# Patient Record
Sex: Male | Born: 1985 | Race: Black or African American | Hispanic: No | Marital: Single | Smoking: Never smoker
Health system: Southern US, Community
[De-identification: ages and names within clinical notes are randomized; demographics above are authoritative.]

## PROBLEM LIST (undated history)

## (undated) DIAGNOSIS — S56922A Laceration of unspecified muscles, fascia and tendons at forearm level, left arm, initial encounter: Secondary | ICD-10-CM

## (undated) DIAGNOSIS — S51812A Laceration without foreign body of left forearm, initial encounter: Secondary | ICD-10-CM

## (undated) HISTORY — PX: LACERATION REPAIR: SHX5168

---

## 2015-01-21 ENCOUNTER — Emergency Department (HOSPITAL_COMMUNITY)
Admission: EM | Admit: 2015-01-21 | Discharge: 2015-01-21 | Disposition: A | Discharge status: Home / Self Care | Payer: Self-pay | Attending: Emergency Medicine | Admitting: Emergency Medicine

## 2015-01-21 ENCOUNTER — Emergency Department (HOSPITAL_COMMUNITY): Payer: Self-pay

## 2015-01-21 ENCOUNTER — Encounter (HOSPITAL_COMMUNITY)
Admission: EM | Disposition: A | Discharge status: Home / Self Care | Payer: Self-pay | Source: Home / Self Care | Attending: Emergency Medicine

## 2015-01-21 ENCOUNTER — Encounter (HOSPITAL_COMMUNITY): Payer: Self-pay | Admitting: *Deleted

## 2015-01-21 ENCOUNTER — Emergency Department (HOSPITAL_COMMUNITY): Payer: Self-pay | Admitting: Certified Registered Nurse Anesthetist

## 2015-01-21 DIAGNOSIS — Y92481 Parking lot as the place of occurrence of the external cause: Secondary | ICD-10-CM | POA: Insufficient documentation

## 2015-01-21 DIAGNOSIS — Z23 Encounter for immunization: Secondary | ICD-10-CM | POA: Insufficient documentation

## 2015-01-21 DIAGNOSIS — S61512A Laceration without foreign body of left wrist, initial encounter: Secondary | ICD-10-CM | POA: Insufficient documentation

## 2015-01-21 DIAGNOSIS — S65012A Laceration of ulnar artery at wrist and hand level of left arm, initial encounter: Secondary | ICD-10-CM | POA: Insufficient documentation

## 2015-01-21 DIAGNOSIS — S66922A Laceration of unspecified muscle, fascia and tendon at wrist and hand level, left hand, initial encounter: Secondary | ICD-10-CM

## 2015-01-21 DIAGNOSIS — S51812A Laceration without foreign body of left forearm, initial encounter: Secondary | ICD-10-CM

## 2015-01-21 DIAGNOSIS — S66022A Laceration of long flexor muscle, fascia and tendon of left thumb at wrist and hand level, initial encounter: Secondary | ICD-10-CM | POA: Insufficient documentation

## 2015-01-21 DIAGNOSIS — S6492XA Injury of unspecified nerve at wrist and hand level of left arm, initial encounter: Secondary | ICD-10-CM | POA: Insufficient documentation

## 2015-01-21 HISTORY — PX: EMBOLECTOMY: SHX44

## 2015-01-21 HISTORY — DX: Laceration without foreign body of left forearm, initial encounter: S51.812A

## 2015-01-21 LAB — CBC WITH DIFFERENTIAL/PLATELET
BASOS ABS: 0 10*3/uL (ref 0.0–0.1)
BASOS PCT: 0 %
EOS PCT: 1 %
Eosinophils Absolute: 0.1 10*3/uL (ref 0.0–0.7)
HCT: 43 % (ref 39.0–52.0)
Hemoglobin: 14.5 g/dL (ref 13.0–17.0)
LYMPHS PCT: 34 %
Lymphs Abs: 2.6 10*3/uL (ref 0.7–4.0)
MCH: 29.2 pg (ref 26.0–34.0)
MCHC: 33.7 g/dL (ref 30.0–36.0)
MCV: 86.5 fL (ref 78.0–100.0)
MONO ABS: 0.5 10*3/uL (ref 0.1–1.0)
Monocytes Relative: 6 %
Neutro Abs: 4.4 10*3/uL (ref 1.7–7.7)
Neutrophils Relative %: 59 %
PLATELETS: 219 10*3/uL (ref 150–400)
RBC: 4.97 MIL/uL (ref 4.22–5.81)
RDW: 13.3 % (ref 11.5–15.5)
WBC: 7.6 10*3/uL (ref 4.0–10.5)

## 2015-01-21 LAB — ABO/RH: ABO/RH(D): B POS

## 2015-01-21 LAB — TYPE AND SCREEN
ABO/RH(D): B POS
ANTIBODY SCREEN: NEGATIVE

## 2015-01-21 LAB — BASIC METABOLIC PANEL
Anion gap: 12 (ref 5–15)
BUN: 9 mg/dL (ref 6–20)
CALCIUM: 9.4 mg/dL (ref 8.9–10.3)
CO2: 23 mmol/L (ref 22–32)
CREATININE: 0.99 mg/dL (ref 0.61–1.24)
Chloride: 103 mmol/L (ref 101–111)
GFR calc Af Amer: >60 mL/min (ref >60)
GLUCOSE: 152 mg/dL — AB (ref 65–99)
POTASSIUM: 3.1 mmol/L — AB (ref 3.5–5.1)
SODIUM: 138 mmol/L (ref 135–145)

## 2015-01-21 SURGERY — EMBOLECTOMY
Anesthesia: General | Site: Arm Lower | Laterality: Left

## 2015-01-21 MED ORDER — ONDANSETRON HCL 4 MG/2ML IJ SOLN
4.0000 mg | Freq: Once | INTRAMUSCULAR | Status: AC
  Administered 2015-01-21: 4 mg via INTRAVENOUS
  Filled 2015-01-21: qty 2

## 2015-01-21 MED ORDER — SUGAMMADEX SODIUM 200 MG/2ML IV SOLN
INTRAVENOUS | Status: DC | PRN
  Administered 2015-01-21: 200 mg via INTRAVENOUS

## 2015-01-21 MED ORDER — HYDROMORPHONE HCL 1 MG/ML IJ SOLN
1.0000 mg | Freq: Once | INTRAMUSCULAR | Status: AC
Start: 2015-01-21 — End: 2015-01-21
  Administered 2015-01-21: 1 mg via INTRAVENOUS
  Filled 2015-01-21: qty 1

## 2015-01-21 MED ORDER — HYDROMORPHONE HCL 1 MG/ML IJ SOLN: 0.2500 mg | INTRAMUSCULAR | Status: DC | PRN

## 2015-01-21 MED ORDER — SUCCINYLCHOLINE CHLORIDE 20 MG/ML IJ SOLN
INTRAMUSCULAR | Status: DC | PRN
  Administered 2015-01-21: 120 mg via INTRAVENOUS

## 2015-01-21 MED ORDER — OXYCODONE-ACETAMINOPHEN 5-325 MG PO TABS: 1.0000 | ORAL_TABLET | ORAL | Status: DC | PRN

## 2015-01-21 MED ORDER — 0.9 % SODIUM CHLORIDE (POUR BTL) OPTIME
TOPICAL | Status: DC | PRN
  Administered 2015-01-21: 1000 mL

## 2015-01-21 MED ORDER — ROCURONIUM BROMIDE 100 MG/10ML IV SOLN
INTRAVENOUS | Status: DC | PRN
  Administered 2015-01-21: 30 mg via INTRAVENOUS

## 2015-01-21 MED ORDER — LACTATED RINGERS IV SOLN
INTRAVENOUS | Status: DC | PRN
  Administered 2015-01-21: 09:00:00 via INTRAVENOUS

## 2015-01-21 MED ORDER — FENTANYL CITRATE (PF) 250 MCG/5ML IJ SOLN
INTRAMUSCULAR | Status: AC
  Filled 2015-01-21: qty 5

## 2015-01-21 MED ORDER — THROMBIN 20000 UNITS EX SOLR
CUTANEOUS | Status: AC
  Filled 2015-01-21: qty 20000

## 2015-01-21 MED ORDER — PROPOFOL 10 MG/ML IV BOLUS
INTRAVENOUS | Status: DC | PRN
  Administered 2015-01-21: 200 mg via INTRAVENOUS

## 2015-01-21 MED ORDER — CEFAZOLIN SODIUM-DEXTROSE 2-3 GM-% IV SOLR
INTRAVENOUS | Status: DC | PRN
  Administered 2015-01-21: 2 g via INTRAVENOUS

## 2015-01-21 MED ORDER — ONDANSETRON HCL 4 MG/2ML IJ SOLN
INTRAMUSCULAR | Status: DC | PRN
  Administered 2015-01-21: 4 mg via INTRAVENOUS

## 2015-01-21 MED ORDER — SUGAMMADEX SODIUM 200 MG/2ML IV SOLN
INTRAVENOUS | Status: AC
  Filled 2015-01-21: qty 2

## 2015-01-21 MED ORDER — FENTANYL CITRATE (PF) 100 MCG/2ML IJ SOLN
INTRAMUSCULAR | Status: DC | PRN
  Administered 2015-01-21: 100 ug via INTRAVENOUS
  Administered 2015-01-21: 50 ug via INTRAVENOUS

## 2015-01-21 MED ORDER — MIDAZOLAM HCL 5 MG/5ML IJ SOLN
INTRAMUSCULAR | Status: DC | PRN
  Administered 2015-01-21: 1 mg via INTRAVENOUS

## 2015-01-21 MED ORDER — PROPOFOL 10 MG/ML IV BOLUS
INTRAVENOUS | Status: AC
  Filled 2015-01-21: qty 20

## 2015-01-21 MED ORDER — OXYCODONE-ACETAMINOPHEN 5-325 MG PO TABS: 1.0000 | ORAL_TABLET | Freq: Four times a day (QID) | ORAL | Status: DC | PRN

## 2015-01-21 MED ORDER — MIDAZOLAM HCL 2 MG/2ML IJ SOLN
INTRAMUSCULAR | Status: AC
  Filled 2015-01-21: qty 4

## 2015-01-21 MED ORDER — TETANUS-DIPHTH-ACELL PERTUSSIS 5-2.5-18.5 LF-MCG/0.5 IM SUSP
0.5000 mL | Freq: Once | INTRAMUSCULAR | Status: AC
  Administered 2015-01-21: 0.5 mL via INTRAMUSCULAR
  Filled 2015-01-21: qty 0.5

## 2015-01-21 MED ORDER — MEPERIDINE HCL 25 MG/ML IJ SOLN: 6.2500 mg | INTRAMUSCULAR | Status: DC | PRN

## 2015-01-21 MED ORDER — SUCCINYLCHOLINE CHLORIDE 20 MG/ML IJ SOLN: 250.0000 mg | INTRAVENOUS | Status: DC | PRN

## 2015-01-21 MED ORDER — SODIUM CHLORIDE 0.9 % IV SOLN
INTRAVENOUS | Status: DC | PRN
  Administered 2015-01-21 (×2)

## 2015-01-21 MED ORDER — LIDOCAINE HCL (CARDIAC) 20 MG/ML IV SOLN
INTRAVENOUS | Status: DC | PRN
  Administered 2015-01-21: 80 mg via INTRAVENOUS

## 2015-01-21 MED ORDER — PROMETHAZINE HCL 25 MG/ML IJ SOLN: 6.2500 mg | INTRAMUSCULAR | Status: DC | PRN

## 2015-01-21 MED ORDER — LACTATED RINGERS IV SOLN: INTRAVENOUS | Status: DC

## 2015-01-21 SURGICAL SUPPLY — 54 items
BANDAGE ELASTIC 4 VELCRO ST LF (GAUZE/BANDAGES/DRESSINGS) ×3 IMPLANT
BANDAGE ESMARK 6X9 LF (GAUZE/BANDAGES/DRESSINGS) IMPLANT
BNDG ESMARK 6X9 LF (GAUZE/BANDAGES/DRESSINGS)
BNDG GAUZE ELAST 4 BULKY (GAUZE/BANDAGES/DRESSINGS) ×3 IMPLANT
CANISTER SUCTION 2500CC (MISCELLANEOUS) ×3 IMPLANT
CATH EMB 3FR 80CM (CATHETERS) IMPLANT
CATH EMB 4FR 80CM (CATHETERS) IMPLANT
CATH EMB 5FR 80CM (CATHETERS) IMPLANT
CLIP TI MEDIUM 24 (CLIP) ×3 IMPLANT
CLIP TI WIDE RED SMALL 24 (CLIP) ×3 IMPLANT
CLIP TI WIDE RED SMALL 6 (CLIP) ×3 IMPLANT
CUFF TOURNIQUET SINGLE 18IN (TOURNIQUET CUFF) IMPLANT
CUFF TOURNIQUET SINGLE 24IN (TOURNIQUET CUFF) IMPLANT
CUFF TOURNIQUET SINGLE 34IN LL (TOURNIQUET CUFF) IMPLANT
CUFF TOURNIQUET SINGLE 44IN (TOURNIQUET CUFF) IMPLANT
DRAIN CHANNEL 15F RND FF W/TCR (WOUND CARE) IMPLANT
DRAPE X-RAY CASS 24X20 (DRAPES) IMPLANT
ELECT REM PT RETURN 9FT ADLT (ELECTROSURGICAL) ×3
ELECTRODE REM PT RTRN 9FT ADLT (ELECTROSURGICAL) ×1 IMPLANT
EVACUATOR SILICONE 100CC (DRAIN) IMPLANT
GAUZE SPONGE 4X4 12PLY STRL (GAUZE/BANDAGES/DRESSINGS) ×3 IMPLANT
GLOVE BIO SURGEON STRL SZ 6.5 (GLOVE) ×2 IMPLANT
GLOVE BIO SURGEON STRL SZ7.5 (GLOVE) ×6 IMPLANT
GLOVE BIO SURGEONS STRL SZ 6.5 (GLOVE) ×1
GLOVE BIOGEL PI IND STRL 6.5 (GLOVE) ×2 IMPLANT
GLOVE BIOGEL PI IND STRL 7.0 (GLOVE) ×1 IMPLANT
GLOVE BIOGEL PI IND STRL 8 (GLOVE) ×1 IMPLANT
GLOVE BIOGEL PI INDICATOR 6.5 (GLOVE) ×4
GLOVE BIOGEL PI INDICATOR 7.0 (GLOVE) ×2
GLOVE BIOGEL PI INDICATOR 8 (GLOVE) ×2
GOWN STRL REUS W/ TWL LRG LVL3 (GOWN DISPOSABLE) ×3 IMPLANT
GOWN STRL REUS W/TWL LRG LVL3 (GOWN DISPOSABLE) ×6
KIT BASIN OR (CUSTOM PROCEDURE TRAY) ×3 IMPLANT
KIT ROOM TURNOVER OR (KITS) ×3 IMPLANT
LIQUID BAND (GAUZE/BANDAGES/DRESSINGS) ×3 IMPLANT
NS IRRIG 1000ML POUR BTL (IV SOLUTION) ×6 IMPLANT
PACK PERIPHERAL VASCULAR (CUSTOM PROCEDURE TRAY) ×3 IMPLANT
PAD ARMBOARD 7.5X6 YLW CONV (MISCELLANEOUS) ×6 IMPLANT
SET COLLECT BLD 21X3/4 12 (NEEDLE) IMPLANT
SPONGE SURGIFOAM ABS GEL 100 (HEMOSTASIS) IMPLANT
STAPLER VISISTAT (STAPLE) IMPLANT
STOPCOCK 4 WAY LG BORE MALE ST (IV SETS) IMPLANT
SUT ETHILON 3 0 PS 1 (SUTURE) ×6 IMPLANT
SUT PROLENE 5 0 C 1 24 (SUTURE) ×3 IMPLANT
SUT PROLENE 6 0 BV (SUTURE) IMPLANT
SUT VIC AB 2-0 CTB1 (SUTURE) ×3 IMPLANT
SUT VIC AB 3-0 SH 27 (SUTURE) ×2
SUT VIC AB 3-0 SH 27X BRD (SUTURE) ×1 IMPLANT
SUT VICRYL 4-0 PS2 18IN ABS (SUTURE) ×3 IMPLANT
SYR 3ML LL SCALE MARK (SYRINGE) ×3 IMPLANT
TRAY FOLEY W/METER SILVER 16FR (SET/KITS/TRAYS/PACK) ×3 IMPLANT
TUBING EXTENTION W/L.L. (IV SETS) IMPLANT
UNDERPAD 30X30 INCONTINENT (UNDERPADS AND DIAPERS) ×3 IMPLANT
WATER STERILE IRR 1000ML POUR (IV SOLUTION) ×3 IMPLANT
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
## 2015-01-21 NOTE — Anesthesia Postprocedure Evaluation (Signed)
  Anesthesia Post-op Note  Patient: Andre Moreno  Procedure(s) Performed: Procedure(s): LEFT ARM EXPLORATION (Left)  Patient Location: PACU  Anesthesia Type:General  Level of Consciousness: awake and alert   Airway and Oxygen Therapy: Patient Spontanous Breathing  Post-op Pain: mild  Post-op Assessment: Post-op Vital signs reviewed and Patient's Cardiovascular Status Stable              Post-op Vital Signs: Reviewed and stable  Last Vitals:  Filed Vitals:   01/21/15 1130  BP:   Pulse: 76  Temp:   Resp: 24    Complications: No apparent anesthesia complications
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
## 2015-01-21 NOTE — Interval H&P Note (Signed)
History and Physical Interval Note:  01/21/2015 10:35 AM  Andre Moreno  has presented today for surgery, with the diagnosis of Arterial Bleed/Left Arm  The various methods of treatment have been discussed with the patient and family. After consideration of risks, benefits and other options for treatment, the patient has consented to  Procedure(s): LEFT ARM EXPLORATION (Left) as a surgical intervention .  The patient's history has been reviewed, patient examined, no change in status, stable for surgery.  I have reviewed the patient's chart and labs.  Questions were answered to the patient's satisfaction.     Waverly Ferrariickson, Jagger Beahm

## 2015-01-21 NOTE — ED Notes (Signed)
Dr Edilia Boickson, vascular, at bedside.
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
## 2015-01-21 NOTE — ED Notes (Signed)
Patient transported to X-ray 

## 2015-01-21 NOTE — Anesthesia Procedure Notes (Signed)
Procedure Name: Intubation Date/Time: 01/21/2015 9:28 AM Performed by: Virgel GessHOLTZMAN, Rynn Markiewicz LEFFEW Pre-anesthesia Checklist: Patient identified, Patient being monitored, Timeout performed, Emergency Drugs available and Suction available Patient Re-evaluated:Patient Re-evaluated prior to inductionOxygen Delivery Method: Circle System Utilized Preoxygenation: Pre-oxygenation with 100% oxygen Intubation Type: IV induction Laryngoscope Size: Mac and 3 Grade View: Grade I Tube type: Oral Tube size: 8.0 mm Number of attempts: 1 Airway Equipment and Method: Stylet Placement Confirmation: ETT inserted through vocal cords under direct vision,  positive ETCO2 and breath sounds checked- equal and bilateral Secured at: 21 cm Tube secured with: Tape Dental Injury: Teeth and Oropharynx as per pre-operative assessment  Comments: RSI with cricoid pressure.

## 2015-01-21 NOTE — Anesthesia Preprocedure Evaluation (Addendum)
Anesthesia Evaluation  Patient identified by MRN, date of birth, ID band Patient awake    Reviewed: Allergy & Precautions, NPO status , Patient's Chart, lab work & pertinent test resultsPreop documentation limited or incomplete due to emergent nature of procedure.  Airway Mallampati: II  TM Distance: >3 FB Neck ROM: Full    Dental  (+) Teeth Intact   Pulmonary neg pulmonary ROS,    breath sounds clear to auscultation       Cardiovascular negative cardio ROS   Rhythm:Regular Rate:Normal     Neuro/Psych negative neurological ROS  negative psych ROS   GI/Hepatic negative GI ROS, Neg liver ROS,   Endo/Other  negative endocrine ROS  Renal/GU negative Renal ROS  negative genitourinary   Musculoskeletal negative musculoskeletal ROS (+)   Abdominal   Peds negative pediatric ROS (+)  Hematology negative hematology ROS (+)   Anesthesia Other Findings   Reproductive/Obstetrics negative OB ROS                            Lab Results  Component Value Date   WBC 7.6 01/21/2015   HGB 14.5 01/21/2015   HCT 43.0 01/21/2015   MCV 86.5 01/21/2015   PLT 219 01/21/2015   Lab Results  Component Value Date   CREATININE 0.99 01/21/2015   BUN 9 01/21/2015   NA 138 01/21/2015   K 3.1* 01/21/2015   CL 103 01/21/2015   CO2 23 01/21/2015   No results found for: INR, PROTIME   Anesthesia Physical Anesthesia Plan  ASA: I and emergent  Anesthesia Plan: General   Post-op Pain Management:    Induction: Intravenous  Airway Management Planned: Oral ETT  Additional Equipment:   Intra-op Plan:   Post-operative Plan: Extubation in OR  Informed Consent: I have reviewed the patients History and Physical, chart, labs and discussed the procedure including the risks, benefits and alternatives for the proposed anesthesia with the patient or authorized representative who has indicated his/her understanding  and acceptance.   Dental advisory given, History available from chart only and Only emergency history available  Plan Discussed with: CRNA  Anesthesia Plan Comments:        Anesthesia Quick Evaluation
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
## 2015-01-21 NOTE — Transfer of Care (Signed)
Immediate Anesthesia Transfer of Care Note  Patient: Andre Moreno  Procedure(s) Performed: Procedure(s): LEFT ARM EXPLORATION (Left)  Patient Location: PACU  Anesthesia Type:General  Level of Consciousness: awake, alert , patient cooperative and responds to stimulation  Airway & Oxygen Therapy: Patient Spontanous Breathing  Post-op Assessment: Report given to RN, Post -op Vital signs reviewed and stable and Patient moving all extremities X 4  Post vital signs: Reviewed and stable  Last Vitals:  Filed Vitals:   01/21/15 0813  BP:   Pulse:   Temp: 36.6 C    Complications: No apparent anesthesia complications

## 2015-01-22 ENCOUNTER — Encounter (HOSPITAL_COMMUNITY): Payer: Self-pay | Admitting: Vascular Surgery

## 2015-01-22 ENCOUNTER — Telehealth: Payer: Self-pay | Admitting: Vascular Surgery

## 2015-01-22 NOTE — Telephone Encounter (Signed)
-----   Message from Sharee PimpleMarilyn K McChesney, RN sent at 01/21/2015 10:11 AM EST ----- Regarding: FYI No followup needed   ----- Message -----    From: Chuck Hinthristopher S Dickson, MD    Sent: 01/21/2015  10:04 AM      To: Vvs Charge Pool Subject: charge                                         PROCEDURE: Ligation left ulnar artery  SURGEON: Di Kindlehristopher S. Edilia Boickson, MD, FACS  ASSIST: Karsten RoKim Trinh, PAC  No f/u with me. He will f/u with the hand surgeons.   CD

## 2015-01-24 ENCOUNTER — Encounter (HOSPITAL_BASED_OUTPATIENT_CLINIC_OR_DEPARTMENT_OTHER): Payer: Self-pay | Admitting: *Deleted

## 2015-01-24 ENCOUNTER — Other Ambulatory Visit: Payer: Self-pay | Admitting: Orthopedic Surgery
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
## 2015-01-28 ENCOUNTER — Encounter (HOSPITAL_BASED_OUTPATIENT_CLINIC_OR_DEPARTMENT_OTHER): Payer: Self-pay | Admitting: *Deleted

## 2015-01-28 ENCOUNTER — Ambulatory Visit (HOSPITAL_BASED_OUTPATIENT_CLINIC_OR_DEPARTMENT_OTHER)
Admission: RE | Admit: 2015-01-28 | Discharge: 2015-01-28 | Disposition: A | Discharge status: Home / Self Care | Payer: Self-pay | Source: Ambulatory Visit | Attending: Orthopedic Surgery | Admitting: Orthopedic Surgery

## 2015-01-28 ENCOUNTER — Ambulatory Visit (HOSPITAL_BASED_OUTPATIENT_CLINIC_OR_DEPARTMENT_OTHER): Payer: Self-pay | Admitting: Certified Registered"

## 2015-01-28 ENCOUNTER — Encounter (HOSPITAL_BASED_OUTPATIENT_CLINIC_OR_DEPARTMENT_OTHER)
Admission: RE | Disposition: A | Discharge status: Home / Self Care | Payer: Self-pay | Source: Ambulatory Visit | Attending: Orthopedic Surgery

## 2015-01-28 DIAGNOSIS — S5402XA Injury of ulnar nerve at forearm level, left arm, initial encounter: Secondary | ICD-10-CM | POA: Insufficient documentation

## 2015-01-28 DIAGNOSIS — S5412XA Injury of median nerve at forearm level, left arm, initial encounter: Secondary | ICD-10-CM | POA: Insufficient documentation

## 2015-01-28 DIAGNOSIS — S56222A Laceration of other flexor muscle, fascia and tendon at forearm level, left arm, initial encounter: Secondary | ICD-10-CM | POA: Insufficient documentation

## 2015-01-28 DIAGNOSIS — W458XXA Other foreign body or object entering through skin, initial encounter: Secondary | ICD-10-CM | POA: Insufficient documentation

## 2015-01-28 DIAGNOSIS — S51812A Laceration without foreign body of left forearm, initial encounter: Secondary | ICD-10-CM | POA: Insufficient documentation

## 2015-01-28 HISTORY — DX: Laceration without foreign body of left forearm, initial encounter: S51.812A

## 2015-01-28 HISTORY — DX: Laceration of unspecified muscles, fascia and tendons at forearm level, left arm, initial encounter: S56.922A

## 2015-01-28 HISTORY — PX: FLEXOR TENDON REPAIR: SHX6501

## 2015-01-28 SURGERY — REPAIR, TENDON, FLEXOR
Anesthesia: General | Site: Arm Lower | Laterality: Left

## 2015-01-28 MED ORDER — LACTATED RINGERS IV SOLN: INTRAVENOUS | Status: DC

## 2015-01-28 MED ORDER — PROMETHAZINE HCL 25 MG/ML IJ SOLN: 6.2500 mg | INTRAMUSCULAR | Status: DC | PRN

## 2015-01-28 MED ORDER — CEFAZOLIN SODIUM-DEXTROSE 2-3 GM-% IV SOLR
INTRAVENOUS | Status: AC
  Filled 2015-01-28: qty 50

## 2015-01-28 MED ORDER — HYDROMORPHONE HCL 1 MG/ML IJ SOLN
INTRAMUSCULAR | Status: AC
  Filled 2015-01-28: qty 1

## 2015-01-28 MED ORDER — FENTANYL CITRATE (PF) 100 MCG/2ML IJ SOLN
INTRAMUSCULAR | Status: AC
  Filled 2015-01-28: qty 2

## 2015-01-28 MED ORDER — 0.9 % SODIUM CHLORIDE (POUR BTL) OPTIME
TOPICAL | Status: DC | PRN
  Administered 2015-01-28: 400 mL

## 2015-01-28 MED ORDER — LACTATED RINGERS IV SOLN
INTRAVENOUS | Status: DC
  Administered 2015-01-28 (×2): via INTRAVENOUS

## 2015-01-28 MED ORDER — BUPIVACAINE-EPINEPHRINE 0.5% -1:200000 IJ SOLN
INTRAMUSCULAR | Status: DC | PRN
  Administered 2015-01-28: 20 mL

## 2015-01-28 MED ORDER — GLYCOPYRROLATE 0.2 MG/ML IJ SOLN: 0.2000 mg | Freq: Once | INTRAMUSCULAR | Status: DC | PRN

## 2015-01-28 MED ORDER — HYDROMORPHONE HCL 1 MG/ML IJ SOLN
0.2500 mg | INTRAMUSCULAR | Status: DC | PRN
  Administered 2015-01-28 (×4): 0.5 mg via INTRAVENOUS

## 2015-01-28 MED ORDER — ONDANSETRON HCL 4 MG/2ML IJ SOLN
INTRAMUSCULAR | Status: DC | PRN
Start: 2015-01-28 — End: 2015-01-28
  Administered 2015-01-28: 4 mg via INTRAVENOUS

## 2015-01-28 MED ORDER — OXYCODONE HCL 5 MG PO TABS
5.0000 mg | ORAL_TABLET | Freq: Once | ORAL | Status: AC
  Administered 2015-01-28: 5 mg via ORAL

## 2015-01-28 MED ORDER — OXYCODONE HCL 5 MG PO TABS
ORAL_TABLET | ORAL | Status: AC
  Filled 2015-01-28: qty 1

## 2015-01-28 MED ORDER — DEXAMETHASONE SODIUM PHOSPHATE 10 MG/ML IJ SOLN
INTRAMUSCULAR | Status: DC | PRN
  Administered 2015-01-28: 10 mg via INTRAVENOUS

## 2015-01-28 MED ORDER — LIDOCAINE HCL (CARDIAC) 20 MG/ML IV SOLN
INTRAVENOUS | Status: DC | PRN
  Administered 2015-01-28: 60 mg via INTRAVENOUS

## 2015-01-28 MED ORDER — FENTANYL CITRATE (PF) 100 MCG/2ML IJ SOLN
50.0000 ug | INTRAMUSCULAR | Status: AC | PRN
  Administered 2015-01-28 (×4): 50 ug via INTRAVENOUS

## 2015-01-28 MED ORDER — LIDOCAINE HCL (PF) 1 % IJ SOLN
INTRAMUSCULAR | Status: AC
  Filled 2015-01-28: qty 30

## 2015-01-28 MED ORDER — MIDAZOLAM HCL 2 MG/2ML IJ SOLN
1.0000 mg | INTRAMUSCULAR | Status: DC | PRN
  Administered 2015-01-28: 2 mg via INTRAVENOUS

## 2015-01-28 MED ORDER — MEPERIDINE HCL 25 MG/ML IJ SOLN: 6.2500 mg | INTRAMUSCULAR | Status: DC | PRN

## 2015-01-28 MED ORDER — CEFAZOLIN SODIUM-DEXTROSE 2-3 GM-% IV SOLR
2.0000 g | INTRAVENOUS | Status: AC
  Administered 2015-01-28: 2 g via INTRAVENOUS

## 2015-01-28 MED ORDER — SUCCINYLCHOLINE 20MG/ML (10ML) SYRINGE FOR MEDFUSION PUMP - OPTIME
INTRAMUSCULAR | Status: DC | PRN
  Administered 2015-01-28: 100 mg via INTRAVENOUS

## 2015-01-28 MED ORDER — OXYCODONE-ACETAMINOPHEN 5-325 MG PO TABS: 1.0000 | ORAL_TABLET | ORAL | Status: AC | PRN

## 2015-01-28 MED ORDER — SCOPOLAMINE 1 MG/3DAYS TD PT72: 1.0000 | MEDICATED_PATCH | Freq: Once | TRANSDERMAL | Status: DC | PRN

## 2015-01-28 MED ORDER — PROPOFOL 10 MG/ML IV BOLUS
INTRAVENOUS | Status: DC | PRN
  Administered 2015-01-28: 200 mg via INTRAVENOUS

## 2015-01-28 MED ORDER — BUPIVACAINE-EPINEPHRINE (PF) 0.5% -1:200000 IJ SOLN
INTRAMUSCULAR | Status: AC
  Filled 2015-01-28: qty 30

## 2015-01-28 SURGICAL SUPPLY — 63 items
BLADE MINI RND TIP GREEN BEAV (BLADE) IMPLANT
BLADE SURG 15 STRL LF DISP TIS (BLADE) ×1 IMPLANT
BLADE SURG 15 STRL SS (BLADE) ×2
BNDG COHESIVE 4X5 TAN STRL (GAUZE/BANDAGES/DRESSINGS) ×3 IMPLANT
BNDG ESMARK 4X9 LF (GAUZE/BANDAGES/DRESSINGS) ×3 IMPLANT
BNDG GAUZE ELAST 4 BULKY (GAUZE/BANDAGES/DRESSINGS) ×6 IMPLANT
CHLORAPREP W/TINT 26ML (MISCELLANEOUS) ×3 IMPLANT
CORDS BIPOLAR (ELECTRODE) ×3 IMPLANT
COVER BACK TABLE 60X90IN (DRAPES) ×3 IMPLANT
COVER MAYO STAND STRL (DRAPES) ×3 IMPLANT
CUFF TOURNIQUET SINGLE 18IN (TOURNIQUET CUFF) ×3 IMPLANT
DECANTER SPIKE VIAL GLASS SM (MISCELLANEOUS) IMPLANT
DRAIN PENROSE 1/4X12 LTX STRL (WOUND CARE) IMPLANT
DRAPE EXTREMITY T 121X128X90 (DRAPE) ×3 IMPLANT
DRAPE SURG 17X23 STRL (DRAPES) ×3 IMPLANT
DRSG EMULSION OIL 3X3 NADH (GAUZE/BANDAGES/DRESSINGS) ×3 IMPLANT
ELECT REM PT RETURN 9FT ADLT (ELECTROSURGICAL)
ELECTRODE REM PT RTRN 9FT ADLT (ELECTROSURGICAL) IMPLANT
GAUZE SPONGE 4X4 12PLY STRL (GAUZE/BANDAGES/DRESSINGS) ×3 IMPLANT
GLOVE BIO SURGEON STRL SZ7.5 (GLOVE) ×3 IMPLANT
GLOVE BIOGEL PI IND STRL 7.0 (GLOVE) ×3 IMPLANT
GLOVE BIOGEL PI IND STRL 8 (GLOVE) ×1 IMPLANT
GLOVE BIOGEL PI INDICATOR 7.0 (GLOVE) ×6
GLOVE BIOGEL PI INDICATOR 8 (GLOVE) ×2
GLOVE ECLIPSE 6.5 STRL STRAW (GLOVE) ×6 IMPLANT
GOWN STRL REUS W/ TWL LRG LVL3 (GOWN DISPOSABLE) ×2 IMPLANT
GOWN STRL REUS W/TWL LRG LVL3 (GOWN DISPOSABLE) ×4
GOWN STRL REUS W/TWL XL LVL3 (GOWN DISPOSABLE) ×3 IMPLANT
LOOP VESSEL MAXI BLUE (MISCELLANEOUS) IMPLANT
LOOP VESSEL MINI RED (MISCELLANEOUS) IMPLANT
NEEDLE HYPO 25X1 1.5 SAFETY (NEEDLE) ×3 IMPLANT
NS IRRIG 1000ML POUR BTL (IV SOLUTION) ×3 IMPLANT
PACK BASIN DAY SURGERY FS (CUSTOM PROCEDURE TRAY) ×3 IMPLANT
PADDING CAST ABS 4INX4YD NS (CAST SUPPLIES) ×2
PADDING CAST ABS COTTON 4X4 ST (CAST SUPPLIES) ×1 IMPLANT
PENCIL BUTTON HOLSTER BLD 10FT (ELECTRODE) IMPLANT
RUBBERBAND STERILE (MISCELLANEOUS) ×3 IMPLANT
SLEEVE SCD COMPRESS KNEE MED (MISCELLANEOUS) IMPLANT
SLING ARM FOAM STRAP LRG (SOFTGOODS) IMPLANT
SPLINT PLASTER CAST XFAST 3X15 (CAST SUPPLIES) ×7 IMPLANT
SPLINT PLASTER XTRA FASTSET 3X (CAST SUPPLIES) ×14
STOCKINETTE 6  STRL (DRAPES) ×2
STOCKINETTE 6 STRL (DRAPES) ×1 IMPLANT
SUT ETHILON 8 0 BV130 4 (SUTURE) ×6 IMPLANT
SUT FIBERWIRE 2-0 18 17.9 3/8 (SUTURE) ×57
SUT MERSILENE 4 0 P 3 (SUTURE) IMPLANT
SUT POLY BUTTON 15MM (SUTURE) IMPLANT
SUT PROLENE 6 0 P 1 18 (SUTURE) IMPLANT
SUT SILK 4 0 SH CR/8 (SUTURE) IMPLANT
SUT STEEL 4 (SUTURE) IMPLANT
SUT SUPRAMID 3-0 (SUTURE) IMPLANT
SUT VIC AB 3-0 SH 27 (SUTURE) ×2
SUT VIC AB 3-0 SH 27X BRD (SUTURE) ×1 IMPLANT
SUT VICRYL 3-0 CR8 SH (SUTURE) IMPLANT
SUT VICRYL RAPIDE 4-0 (SUTURE) ×3 IMPLANT
SUT VICRYL RAPIDE 4/0 PS 2 (SUTURE) ×3 IMPLANT
SUTURE FIBERWR 2-0 18 17.9 3/8 (SUTURE) ×19 IMPLANT
SYR BULB 3OZ (MISCELLANEOUS) ×3 IMPLANT
SYRINGE 10CC LL (SYRINGE) IMPLANT
TOWEL OR 17X24 6PK STRL BLUE (TOWEL DISPOSABLE) ×3 IMPLANT
TUBE CONNECTING 20'X1/4 (TUBING)
TUBE CONNECTING 20X1/4 (TUBING) IMPLANT
UNDERPAD 30X30 (UNDERPADS AND DIAPERS) ×3 IMPLANT

## 2015-01-28 NOTE — Anesthesia Postprocedure Evaluation (Signed)
Anesthesia Post Note  Patient: Andre Moreno  Procedure(s) Performed: Procedure(s) (LRB): REPAIR LEFT FOREARM FLEXOR TENDON AND NERVES (Left)  Patient location during evaluation: PACU Anesthesia Type: General Level of consciousness: sedated and patient cooperative Pain management: pain level controlled Vital Signs Assessment: post-procedure vital signs reviewed and stable Respiratory status: spontaneous breathing Cardiovascular status: stable Anesthetic complications: no    Last Vitals:  Filed Vitals:   01/28/15 1200 01/28/15 1215  BP: 137/93 150/91  Pulse: 70 67  Temp:  36.7 C  Resp: 8 18    Last Pain:  Filed Vitals:   01/28/15 1217  PainSc: 5                  Lewie LoronJohn Lionel Woodberry
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
## 2015-01-28 NOTE — Anesthesia Preprocedure Evaluation (Signed)
Anesthesia Evaluation  Patient identified by MRN, date of birth, ID band Patient awake    Reviewed: Allergy & Precautions, NPO status , Patient's Chart, lab work & pertinent test resultsPreop documentation limited or incomplete due to emergent nature of procedure.  Airway Mallampati: II  TM Distance: >3 FB Neck ROM: Full    Dental  (+) Teeth Intact   Pulmonary neg pulmonary ROS,    breath sounds clear to auscultation       Cardiovascular negative cardio ROS   Rhythm:Regular Rate:Normal     Neuro/Psych negative neurological ROS  negative psych ROS   GI/Hepatic negative GI ROS, Neg liver ROS,   Endo/Other  negative endocrine ROS  Renal/GU negative Renal ROS  negative genitourinary   Musculoskeletal negative musculoskeletal ROS (+)   Abdominal   Peds negative pediatric ROS (+)  Hematology negative hematology ROS (+)   Anesthesia Other Findings   Reproductive/Obstetrics negative OB ROS                             Lab Results  Component Value Date   WBC 7.6 01/21/2015   HGB 14.5 01/21/2015   HCT 43.0 01/21/2015   MCV 86.5 01/21/2015   PLT 219 01/21/2015   Lab Results  Component Value Date   CREATININE 0.99 01/21/2015   BUN 9 01/21/2015   NA 138 01/21/2015   K 3.1* 01/21/2015   CL 103 01/21/2015   CO2 23 01/21/2015   No results found for: INR, PROTIME   Anesthesia Physical  Anesthesia Plan  ASA: I  Anesthesia Plan: General   Post-op Pain Management:    Induction: Intravenous  Airway Management Planned: LMA  Additional Equipment:   Intra-op Plan:   Post-operative Plan: Extubation in OR  Informed Consent: I have reviewed the patients History and Physical, chart, labs and discussed the procedure including the risks, benefits and alternatives for the proposed anesthesia with the patient or authorized representative who has indicated his/her understanding and  acceptance.   Dental advisory given  Plan Discussed with: CRNA  Anesthesia Plan Comments:         Anesthesia Quick Evaluation

## 2015-01-28 NOTE — Discharge Instructions (Signed)

## 2015-01-28 NOTE — Anesthesia Procedure Notes (Signed)
Procedure Name: Intubation Date/Time: 01/28/2015 8:59 AM Performed by: Shoichi Mielke D Pre-anesthesia Checklist: Patient identified, Emergency Drugs available, Suction available and Patient being monitored Patient Re-evaluated:Patient Re-evaluated prior to inductionOxygen Delivery Method: Circle System Utilized Preoxygenation: Pre-oxygenation with 100% oxygen Intubation Type: IV induction Ventilation: Mask ventilation without difficulty Laryngoscope Size: Mac and 3 Grade View: Grade II Tube type: Oral Tube size: 7.0 mm Number of attempts: 1 Airway Equipment and Method: Stylet and Oral airway Placement Confirmation: ETT inserted through vocal cords under direct vision,  positive ETCO2 and breath sounds checked- equal and bilateral Secured at: 21 cm Tube secured with: Tape Dental Injury: Teeth and Oropharynx as per pre-operative assessment

## 2015-01-28 NOTE — Interval H&P Note (Signed)
History and Physical Interval Note:  01/28/2015 7:37 AM  Andre Moreno  has presented today for surgery, with the diagnosis of LEFT FOREARM LACERATION WITH TENDON AND NERVE S56.922A, S54.02XA, R60.45WUS54.12XA  The various methods of treatment have been discussed with the patient and family. After consideration of risks, benefits and other options for treatment, the patient has consented to  Procedure(s): REPAIR LEFT FOREARM FLEXOR TENDON AND NERVES (Left) as a surgical intervention .  The patient's history has been reviewed, patient examined, no change in status, stable for surgery.  I have reviewed the patient's chart and labs.  Questions were answered to the patient's satisfaction.     Elder Davidian A.

## 2015-01-28 NOTE — Transfer of Care (Signed)
Immediate Anesthesia Transfer of Care Note  Patient: Andre Moreno  Procedure(s) Performed: Procedure(s): REPAIR LEFT FOREARM FLEXOR TENDON AND NERVES (Left)  Patient Location: PACU  Anesthesia Type:General  Level of Consciousness: awake, alert , oriented and patient cooperative  Airway & Oxygen Therapy: Patient Spontanous Breathing and Patient connected to face mask oxygen  Post-op Assessment: Report given to RN and Post -op Vital signs reviewed and stable  Post vital signs: Reviewed and stable  Last Vitals:  Filed Vitals:   01/28/15 0741  BP: 130/97  Pulse: 94  Temp: 36.8 C  Resp: 20    Complications: No apparent anesthesia complications

## 2015-01-29 ENCOUNTER — Encounter (HOSPITAL_BASED_OUTPATIENT_CLINIC_OR_DEPARTMENT_OTHER): Payer: Self-pay | Admitting: Orthopedic Surgery

## 2015-02-07 ENCOUNTER — Ambulatory Visit: Payer: Self-pay | Admitting: Occupational Therapy

## 2015-02-14 ENCOUNTER — Ambulatory Visit: Payer: Self-pay | Attending: Orthopedic Surgery | Admitting: Occupational Therapy

## 2015-02-14 ENCOUNTER — Encounter: Payer: Self-pay | Admitting: Occupational Therapy

## 2015-02-14 DIAGNOSIS — R29898 Other symptoms and signs involving the musculoskeletal system: Secondary | ICD-10-CM

## 2015-02-14 DIAGNOSIS — M2560 Stiffness of unspecified joint, not elsewhere classified: Secondary | ICD-10-CM | POA: Insufficient documentation

## 2015-02-14 DIAGNOSIS — M6289 Other specified disorders of muscle: Secondary | ICD-10-CM | POA: Insufficient documentation

## 2015-02-14 DIAGNOSIS — M79645 Pain in left finger(s): Secondary | ICD-10-CM | POA: Insufficient documentation

## 2015-02-14 DIAGNOSIS — R201 Hypoesthesia of skin: Secondary | ICD-10-CM | POA: Insufficient documentation

## 2015-02-14 NOTE — Patient Instructions (Signed)
SPLINT WEAR AND CARE   WEARING SCHEDULE:  Wear splint at ALL times except for hygiene care (May remove finger bar only for exercises explained below and then immediately place back on ONLY if directed by the therapist)  PURPOSE:  To prevent movement and for protection until injury can heal  CARE OF SPLINT:  Keep splint away from heat sources including: stove, radiator or furnace, or a car in sunlight. The splint can melt and will no longer fit you properly  Keep away from pets and children  Clean the splint with rubbing alcohol 1-2 times per day.  * During this time, make sure you also clean your hand/arm as instructed by your therapist and/or perform dressing changes as needed. Then dry hand/arm completely before replacing splint. (When cleaning hand/arm, keep it immobilized in same position until splint is replaced)  PRECAUTIONS/POTENTIAL PROBLEMS: *If you notice or experience increased pain, swelling, numbness, or a lingering reddened area from the splint: Contact your therapist immediately by calling [phone]. You must wear the splint for protection, but we will get you scheduled for adjustments as quickly as possible.  (If only straps or hooks need to be replaced and NO adjustments to the splint need to be made, just call the office ahead and let them know you are coming in)  If you have any medical concerns or signs of infection, please call your doctor immediately          EXERCISES  UNSTRAP FINGER BAR FIRST   Use other hand (Rt) to make gentle fist of Lt hand x 5 sec., remove Rt hand and hold position x 5 sec.,  Then open fingers of Lt hand up to back of splint as much as able, then take other hand to gently push further to back of splint and hold 10 sec.  Repeat 25 times, 6-8 times/day
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
## 2015-02-20 ENCOUNTER — Encounter: Payer: Self-pay | Admitting: Occupational Therapy

## 2015-02-20 ENCOUNTER — Ambulatory Visit: Payer: Self-pay | Admitting: Occupational Therapy

## 2015-02-20 DIAGNOSIS — R29898 Other symptoms and signs involving the musculoskeletal system: Secondary | ICD-10-CM

## 2015-02-20 DIAGNOSIS — R201 Hypoesthesia of skin: Secondary | ICD-10-CM

## 2015-02-20 DIAGNOSIS — M2560 Stiffness of unspecified joint, not elsewhere classified: Secondary | ICD-10-CM
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
## 2015-02-20 NOTE — Patient Instructions (Signed)
Scar Massage Purpose: To soften/smooth scar tissue.   To desensitize sensitive areas after surgery.   To mechanically break up inner scar tissue, adhesions, therefore allowing freer        movement of injured tendons and muscle.  Technique: Use a cream to massage with, as it insures a smooth gliding motion and avoids irritation caused by rubbing skin to skin.  Cream is preferred over a lotion.   May begin as soon as any suture areas are healed.   Apply a firm, steady pressure with your finger-tip, pulling the skin in a circular motion over the scarred area.  Do not rub.  Message 5 minutes, at least 2 times a day, unless you are getting tender afterwards   EXERCISES: INSIDE SPLINT!!  In addition to what you have been doing, now begin trying to actively make a fist inside the splint, the push further with other hand and hold new position as able. Straighten fingers all the way to back of splint, and if needed, stretch further to back of splint with other hand.

## 2015-02-28 ENCOUNTER — Ambulatory Visit: Payer: Self-pay | Admitting: Occupational Therapy

## 2015-03-18 ENCOUNTER — Ambulatory Visit: Payer: Self-pay | Attending: Orthopedic Surgery | Admitting: Occupational Therapy

## 2015-03-18 DIAGNOSIS — R201 Hypoesthesia of skin: Secondary | ICD-10-CM | POA: Insufficient documentation

## 2015-03-18 DIAGNOSIS — M2560 Stiffness of unspecified joint, not elsewhere classified: Secondary | ICD-10-CM | POA: Insufficient documentation

## 2015-03-18 DIAGNOSIS — M6289 Other specified disorders of muscle: Secondary | ICD-10-CM | POA: Insufficient documentation

## 2015-03-18 DIAGNOSIS — M79645 Pain in left finger(s): Secondary | ICD-10-CM | POA: Insufficient documentation

## 2015-03-20 ENCOUNTER — Ambulatory Visit: Payer: Self-pay | Admitting: Occupational Therapy

## 2015-03-20 DIAGNOSIS — M2560 Stiffness of unspecified joint, not elsewhere classified: Secondary | ICD-10-CM

## 2015-03-20 DIAGNOSIS — R201 Hypoesthesia of skin: Secondary | ICD-10-CM

## 2015-03-20 DIAGNOSIS — R29898 Other symptoms and signs involving the musculoskeletal system: Secondary | ICD-10-CM

## 2015-03-20 DIAGNOSIS — M79645 Pain in left finger(s): Secondary | ICD-10-CM

## 2015-03-20 NOTE — Therapy (Signed)
[facility] [address] [place], [place], [address] Phone: [phone]   Fax:  [phone]  Occupational Therapy Treatment  Patient Details  Name: Andre Moreno MRN: [id number] Date of Birth: 09-11-1985 Referring Provider: Dr. Mack Hook  Encounter Date: 03/20/2015      OT End of Session - 03/20/15 1241    Visit Number 3   Date for OT Re-Evaluation 04/14/14   OT Start Time 1105   OT Stop Time 1200   OT Time Calculation (min) 55 min   Activity Tolerance Patient tolerated treatment well;Patient limited by lethargy   Behavior During Therapy [facility] for tasks assessed/performed      Past Medical History  Diagnosis Date  . Laceration of left forearm with tendon involvement 01/21/2015    also nerve involvement    Past Surgical History  Procedure Laterality Date  . Embolectomy Left 01/21/2015    Procedure: LEFT ARM EXPLORATION;  Surgeon: Chuck Hint, MD;  Location: [facility];  Service: Vascular;  Laterality: Left;  . Laceration repair Left as a teenager    arm  . Flexor tendon repair Left 01/28/2015    Procedure: REPAIR LEFT FOREARM FLEXOR TENDON AND NERVES;  Surgeon: Mack Hook, MD;  Location: [facility];  Service: Orthopedics;  Laterality: Left;    There were no vitals filed for this visit.  Visit Diagnosis:  No diagnosis found.      Subjective Assessment - 03/20/15 1159    Subjective  Pt reports he is living in [place]   Repetition Increases Symptoms   Patient Stated Goals I need my hand working   Currently in Pain? Yes   Pain Score 4    Pain Location Hand   Pain Orientation Left   Pain Descriptors / Indicators Aching;Tingling   Pain Type Acute pain   Pain Onset More than a month ago   Pain Frequency Constant   Aggravating Factors  cold   Pain Relieving Factors pain meds   Effect of Pain on Daily Activities limits       Treatment: Pt arrived wearing DBS.  Pt  demonstrates significant tightness / scar tissue at incision. Ultrasound to volar forearm x 8 minx , 0.8 w/cm2, 50% pulsed, for scar mobilization. Pt was instructed to continue his DBS x approx 1 week between exercises and at night, palmar  Portion was replaced with a strap and foam was added to wrist strap to minimize swelling at incision. Pt was instructed in A/ROM wrist and finger exercises outside of the splint, including PIP and DIP blocking. PT demonstrates max difficulty with PIP extension during blocking.  NMES 50 pps 250 pw 10 secs cycle intensity 10, to wrist and finger flexors with pt performing A/ROM during on cycle as able.(Pt was unable to tolerate a higher intensity.)                          OT Short Term Goals - 03/20/15 1246    OT SHORT TERM GOAL #1   Title Pt independent with splint wear and care (all STG's due 03/15/14)   Time 4   Period Weeks   Status Achieved   OT SHORT TERM GOAL #2   Title Independent with initial HEP    Time 4   Period Weeks   Status Achieved   OT SHORT TERM GOAL #3   Title Pain less than or equal to 4/10 with ROM exercises   Baseline up to  8/10   Period Weeks   Status Achieved           OT Long Term Goals - 02/14/15 1633    OT LONG TERM GOAL #1   Title Pt independent with updated HEP  (ALL LTG's due 04/15/15)   Time 8   Period Weeks   Status New   OT LONG TERM GOAL #2   Title Pt to perform 90% gross finger flexion and extension Lt hand   Time 8   Period Weeks   Status New   OT LONG TERM GOAL #3   Title Pt to perform 25 lbs or greater grip strength Lt hand to assist with opening, pulling, lifting activities   Time 8   Period Weeks   Status New   OT LONG TERM GOAL #4   Title Pt to return to using Lt hand as assist in all ADLS   Time 8   Period Weeks   Status New               Plan - 03/20/15 1242    Clinical Impression Statement Pt is progressing slowly as he has not been seen since 02/20/15.  Therapist initated A/ROM outside of the splint today. Pt is behind in the protocol as he has not been seen consistently. Pt demonstrates limited A/ROM particularly PIP extension. Pt was instructed to wear DBS splint x 1 more week  between exercises and at night then to discontinue.   Pt will benefit from skilled therapeutic intervention in order to improve on the following deficits (Retired) Decreased coordination;Decreased range of motion;Impaired flexibility;Decreased safety awareness;Increased edema;Impaired sensation;Decreased knowledge of precautions;Decreased activity tolerance;Decreased skin integrity;Decreased scar mobility;Impaired UE functional use;Pain;Decreased strength   Rehab Potential Good   OT Frequency 2x / week   OT Duration 8 weeks   OT Treatment/Interventions Self-care/ADL training;Therapeutic exercise;Patient/family education;Ultrasound;Manual Therapy;Splinting;Therapeutic exercises;Cryotherapy;Parrafin;DME and/or AE instruction;Compression bandaging;Therapeutic activities;Electrical Stimulation;Fluidtherapy;Scar mobilization;Moist Heat;Passive range of motion   Plan Discontinue DBS next week and begin P/ROM outside of the splint, (6weeks in protocol)- consider extension splinting for the future?   Consulted and Agree with Plan of Care Patient        Problem List There are no active problems to display for this patient.   Saranya Harlin 03/20/2015, 12:46 PM Keene BreathKathryn Jams Trickett, OTR/L [phone] Phone: [phone] 12:47 PM 03/20/2015 [facility] [address] [place], [place], [address] Phone: [phone]   Fax:  [phone]  Name: Andre Moreno MRN: [id number] Date of Birth: 24-May-1985

## 2015-03-25 ENCOUNTER — Encounter: Payer: Self-pay | Admitting: Occupational Therapy

## 2015-03-25 ENCOUNTER — Ambulatory Visit: Payer: Self-pay | Admitting: Occupational Therapy

## 2015-03-25 DIAGNOSIS — M79645 Pain in left finger(s): Secondary | ICD-10-CM

## 2015-03-25 DIAGNOSIS — M2560 Stiffness of unspecified joint, not elsewhere classified: Secondary | ICD-10-CM

## 2015-03-25 NOTE — Therapy (Signed)
[facility] [address] [place], [place], [address] Phone: [phone]   Fax:  [phone]  Occupational Therapy Treatment  Patient Details  Name: Burr MedicoMichael Herzberg MRN: [id number] Date of Birth: 10/02/1985 Referring Provider: Dr. Mack Hookavid Thompson  Encounter Date: 03/25/2015      OT End of Session - 03/25/15 1309    Visit Number 4   Number of Visits 16   Date for OT Re-Evaluation 04/14/14   Authorization Type self pay   OT Start Time 1145   OT Stop Time 1300   OT Time Calculation (min) 75 min   Equipment Utilized During Treatment splinting   Activity Tolerance Patient tolerated treatment well      Past Medical History  Diagnosis Date  . Laceration of left forearm with tendon involvement 01/21/2015    also nerve involvement    Past Surgical History  Procedure Laterality Date  . Embolectomy Left 01/21/2015    Procedure: LEFT ARM EXPLORATION;  Surgeon: Chuck Hinthristopher S Dickson, MD;  [facility];  Service: Vascular;  Laterality: Left;  . Laceration repair Left as a teenager    arm  . Flexor tendon repair Left 01/28/2015    Procedure: REPAIR LEFT FOREARM FLEXOR TENDON AND NERVES;  Surgeon: Mack Hookavid Thompson, MD;  Location: [facility];  Service: Orthopedics;  Laterality: Left;    There were no vitals filed for this visit.  Visit Diagnosis:  Stiffness of multiple joints  Pain of finger of left hand      Subjective Assessment - 03/25/15 1153    Repetition Increases Symptoms   Patient Stated Goals I need my hand working   Currently in Pain? Yes   Pain Score 2    Pain Location Hand   Pain Orientation Left   Pain Descriptors / Indicators Aching;Tingling   Pain Type Chronic pain;Acute pain   Pain Onset More than a month ago   Pain Frequency Constant   Aggravating Factors  COLD   Pain Relieving Factors Heat                      OT Treatments/Exercises (OP) - 03/25/15 0001    Hand Exercises   Other Hand Exercises Reviewed all exercises previously issued and added full composite extension stretch in P/ROM, as well as aggressive active, followed by place and hold fisting for composite flexion. Pt with noted inability to perform active extension at IP's and minimal flexion at IP's - mostly all from MP's.    Modalities   Modalities Electrical Stimulation;Ultrasound   Ultrasound   Ultrasound Location volar wrist at incision area   Ultrasound Parameters 1.0 wts/cm2, pulsed 20%, 3 MHz   Ultrasound Goals --  scar tissue management   Splinting   Splinting D/C Dorsal block splint (DBS) per protocol - pt 2 weeks behind protocol. Fabricated and fitted full extension resting pan splint (as able) per protocol due to extrinsic flexor tightness. Pt unable to achieve full wrist extension d/t PIP/DIP flexion if fully extended at wrist. However pt only able to get to neutral wrist extension regardless due to tightness/significant scar tissue. Issued splint   Manual Therapy   Manual therapy comments Extensive P/ROM in full composite extension, as well as place and hold exercises in full composite flexion (as able)                 OT Education - 03/25/15 1253    Education provided Yes   Education Details see pt instructions  for updates    Person(s) Educated Patient   Methods Explanation;Demonstration;Handout   Comprehension Verbalized understanding;Returned demonstration          OT Short Term Goals - 03/20/15 1246    OT SHORT TERM GOAL #1   Title Pt independent with splint wear and care (all STG's due 03/15/14)   Time 4   Period Weeks   Status Achieved   OT SHORT TERM GOAL #2   Title Independent with initial HEP    Time 4   Period Weeks   Status Achieved   OT SHORT TERM GOAL #3   Title Pain less than or equal to 4/10 with ROM exercises   Baseline up to 8/10   Period Weeks   Status Achieved           OT Long Term Goals - 02/14/15 1633    OT LONG TERM  GOAL #1   Title Pt independent with updated HEP  (ALL LTG's due 04/15/15)   Time 8   Period Weeks   Status New   OT LONG TERM GOAL #2   Title Pt to perform 90% gross finger flexion and extension Lt hand   Time 8   Period Weeks   Status New   OT LONG TERM GOAL #3   Title Pt to perform 25 lbs or greater grip strength Lt hand to assist with opening, pulling, lifting activities   Time 8   Period Weeks   Status New   OT LONG TERM GOAL #4   Title Pt to return to using Lt hand as assist in all ADLS   Time 8   Period Weeks   Status New               Plan - 03/25/15 1310    Clinical Impression Statement Pt with significant scar tissue and extrinsic flexor tightness. Pt unable to perform full composite extension or flexion. Pt now 8 weeks post op but 2 weeks behind protocol due to missed appts   Plan adjust resting pan splint for further wrist extension if able, continue [place] (Try continuous) and estim, begin putty and wrist strengthening as able, weighted stretches   Consulted and Agree with Plan of Care Patient        Problem List There are no active problems to display for this patient.   Kelli Churn, OTR/L 03/25/2015, 1:15 PM  [facility] [address] [place], [place], [address] Phone: [phone]   Fax:  [phone]  Name: Ragan Reale MRN: [id number] Date of Birth: 03/27/1985

## 2015-03-25 NOTE — Patient Instructions (Addendum)
INSTRUCTIONS:   D/C dorsal block splint (the first splint)  Wear new splint gradually increasing time during the day (while taking off every hour for exercises). Once you can tolerate approx. 4 hours, switch to wearing splint at night time only. However, if fingers begin to curl more, than continue to wear some during the day as well, taking off for exercises every hour and for functional use (using hand to help dress, bath, pick up light objects, etc)  Continue aggressively with all exercises. Okay now to stretch hand and wrist open, and tight fists are ok. Still NO lifting or pulling with resistance or heavy weight  Continue aggressively with scar massage at least 2x/day for 5 minutes  Call Dr. Janee Mornhompson to get earlier appointment

## 2015-04-02 ENCOUNTER — Ambulatory Visit: Payer: Self-pay | Admitting: Occupational Therapy

## 2015-04-02 ENCOUNTER — Encounter: Payer: Self-pay | Admitting: Occupational Therapy

## 2015-04-02 DIAGNOSIS — R29898 Other symptoms and signs involving the musculoskeletal system: Secondary | ICD-10-CM

## 2015-04-02 DIAGNOSIS — R201 Hypoesthesia of skin: Secondary | ICD-10-CM

## 2015-04-02 DIAGNOSIS — M2560 Stiffness of unspecified joint, not elsewhere classified: Secondary | ICD-10-CM

## 2015-04-02 NOTE — Therapy (Signed)
[facility] [address] [place], [place], [address] Phone: [phone]   Fax:  [phone]  Occupational Therapy Treatment  Patient Details  Name: Andre Moreno MRN: [id number] Date of Birth: 1985/11/29 Referring Provider: Dr. Mack Hook  Encounter Date: 04/02/2015      OT End of Session - 04/02/15 1313    Visit Number 5   Number of Visits 16   Date for OT Re-Evaluation 04/14/14   Authorization Type self pay   OT Start Time 1105   OT Stop Time 1230   OT Time Calculation (min) 85 min   Activity Tolerance Patient tolerated treatment well      Past Medical History  Diagnosis Date  . Laceration of left forearm with tendon involvement 01/21/2015    also nerve involvement    Past Surgical History  Procedure Laterality Date  . Embolectomy Left 01/21/2015    Procedure: LEFT ARM EXPLORATION;  Surgeon: Chuck Hint, MD;  Location: [facility] OR;  Service: Vascular;  Laterality: Left;  . Laceration repair Left as a teenager    arm  . Flexor tendon repair Left 01/28/2015    Procedure: REPAIR LEFT FOREARM FLEXOR TENDON AND NERVES;  Surgeon: Mack Hook, MD;  Location: [facility];  Service: Orthopedics;  Laterality: Left;    There were no vitals filed for this visit.  Visit Diagnosis:  Stiffness of multiple joints  Weakness of left hand  Impaired sensation      Subjective Assessment - 04/02/15 1107    Subjective  My thumb isn't working either. I did call the MD and got my appointment moved up to 04/09/15.    Repetition Increases Symptoms   Patient Stated Goals I need my hand working   Currently in Pain? Yes   Pain Score 3    Pain Location Hand   Pain Orientation Left   Pain Descriptors / Indicators Aching;Tingling   Pain Type Chronic pain   Pain Onset More than a month ago   Pain Frequency Constant   Aggravating Factors  cold   Pain Relieving Factors heat            [facility] OT  Assessment - 04/02/15 0001    Observation/Other Assessments   Observations Pt was observed to demo IP flexion with wrist and MP extension, which therapist initially thought was d/t extrinsic flexor tendon tightness, however noted when MP's blocked in slight flexion, pt could achieve almost full IP extension with wrist extension. Pt also reported decreased thumb function which would not be affected from flexor tendon repair to other digits. When therapist assessed, pt could not perform palmar abduction and noted signifcant wasting of thenar eminence and loss of lumbrical musculature. Pt demo significant ulnar n. impairments with presentation. Due to this, pt adjusted resting pan extension splint (previously made) to provide MP support to prevent clawing for pm wear. Therapist also fabricated anti-claw splint for daytime. (see treatment for details). Pt still has significant scar tissue and some extrinsic tightness but most impairments and presentation appear to be from ulnar n. dysfunction   Hand Function   Right Hand Grip (lbs) 85 lbs   Left Hand Grip (lbs) 0 lbs                  OT Treatments/Exercises (OP) - 04/02/15 0001    Hand Exercises   Other Hand Exercises Pt issued putty exercises and issued yellow putty for mass composite finger flexion ex's. Pt also issued putty for  thumb flexion due to reports of "thumb not working" but he was compensating only with IP flexion and therapist realized this was due to nerve damage therefore therapist d/c putty ex with thumb. Pt instructed to perform thumb palmar abduction A/ROM ex's as able instead.    Other Hand Exercises Pt instructed to continue with A/ROM in composite finger flexion, followed by place and hold for further flexion, but now to adapt extension stretches to keep MP flexion (due to clawing and ulnar n. damage noted).    Ultrasound   Ultrasound Location volar wrist at incision   Ultrasound Parameters 0.8 wts/cm2, continuous 100%, 3  Mhz x 8 min   Ultrasound Goals --  scar management   Splinting   Splinting Adjusted full extension resting pan splint (from previous session) to protect palmar arch and prevent clawing by providing MP flexion and bringing thumb more into palmar abduction as able. Pt instructed to wear at night and will fabricate new better fitting pm splint at next visit. Fabricated and fitted anti-claw splint for daytime to prevent clawing and allow IP extension (Pt unable to achieve IP ext. without anti-claw splint).  Reviewed wear and care of each splint                  OT Short Term Goals - 03/20/15 1246    OT SHORT TERM GOAL #1   Title Pt independent with splint wear and care (all STG's due 03/15/14)   Time 4   Period Weeks   Status Achieved   OT SHORT TERM GOAL #2   Title Independent with initial HEP    Time 4   Period Weeks   Status Achieved   OT SHORT TERM GOAL #3   Title Pain less than or equal to 4/10 with ROM exercises   Baseline up to 8/10   Period Weeks   Status Achieved           OT Long Term Goals - 02/14/15 1633    OT LONG TERM GOAL #1   Title Pt independent with updated HEP  (ALL LTG's due 04/15/15)   Time 8   Period Weeks   Status New   OT LONG TERM GOAL #2   Title Pt to perform 90% gross finger flexion and extension Lt hand   Time 8   Period Weeks   Status New   OT LONG TERM GOAL #3   Title Pt to perform 25 lbs or greater grip strength Lt hand to assist with opening, pulling, lifting activities   Time 8   Period Weeks   Status New   OT LONG TERM GOAL #4   Title Pt to return to using Lt hand as assist in all ADLS   Time 8   Period Weeks   Status New               Plan - 04/02/15 1313    Clinical Impression Statement Pt with noted significant ulnar n. impairment (which was initially contributed to extrinsic flexor tightness, but noted atrophy of hand musculature and thumb dysfunction due to nerve impairment vs. due to flexor tendon injury.    Plan  continue [place], fabricate better fitting pm splint for greater wrist extension, MP flexion and IP extension and thumb palmer abduction, continue hand and putty exercises. Assess anti-claw splint and any further updates from MD    Consulted and Agree with Plan of Care Patient        Problem List There are no  active problems to display for this patient.   Kelli Churn, OTR/L 04/02/2015, 2:26 PM  [facility] [address] [place], [place], [address] Phone: [phone]   Fax:  [phone]  Name: Andre Moreno MRN: [id number] Date of Birth: 07/04/1985

## 2015-04-09 ENCOUNTER — Ambulatory Visit: Payer: Self-pay | Admitting: Occupational Therapy

## 2015-04-09 ENCOUNTER — Encounter: Payer: Self-pay | Admitting: Occupational Therapy

## 2015-04-09 DIAGNOSIS — M2560 Stiffness of unspecified joint, not elsewhere classified: Secondary | ICD-10-CM

## 2015-04-09 DIAGNOSIS — R29898 Other symptoms and signs involving the musculoskeletal system: Secondary | ICD-10-CM

## 2015-04-09 NOTE — Therapy (Signed)
[facility] [address] [place], [place], [address] Phone: [phone]   Fax:  [phone]  Occupational Therapy Treatment  Patient Details  Name: Andre Moreno MRN: [id number] Date of Birth: 1985-06-09 Referring Provider: Dr. Mack Hook  Encounter Date: 04/09/2015      OT End of Session - 04/09/15 1153    Visit Number 6   Number of Visits 16   Date for OT Re-Evaluation 04/14/14   Authorization Type self pay   OT Start Time 1017   OT Stop Time 1137   OT Time Calculation (min) 80 min   Activity Tolerance Patient tolerated treatment well      Past Medical History  Diagnosis Date  . Laceration of left forearm with tendon involvement 01/21/2015    also nerve involvement    Past Surgical History  Procedure Laterality Date  . Embolectomy Left 01/21/2015    Procedure: LEFT ARM EXPLORATION;  Surgeon: Chuck Hint, MD;  Location: [facility] OR;  Service: Vascular;  Laterality: Left;  . Laceration repair Left as a teenager    arm  . Flexor tendon repair Left 01/28/2015    Procedure: REPAIR LEFT FOREARM FLEXOR TENDON AND NERVES;  Surgeon: Mack Hook, MD;  Location: [facility];  Service: Orthopedics;  Laterality: Left;    There were no vitals filed for this visit.  Visit Diagnosis:  Stiffness of multiple joints  Weakness of left hand      Subjective Assessment - 04/09/15 1102    Subjective  The Dr. just gave me percocet. He wants to see me again in 2 months   Repetition Increases Symptoms   Patient Stated Goals I need my hand working   Currently in Pain? Yes   Pain Score 3    Pain Location Hand   Pain Orientation Left   Pain Descriptors / Indicators Aching;Tingling;Throbbing   Pain Onset More than a month ago   Pain Frequency Constant   Aggravating Factors  too much stretch   Pain Relieving Factors meds                      OT Treatments/Exercises (OP) - 04/09/15  0001    Exercises   Exercises Hand   Hand Exercises   Other Hand Exercises Reviewed and return demo of all exercises with emphasis on active and passive wrist extension, active thumb palmer abduction (as able), passive finger IP extension while preventing MP hyperextension; and active, place and hold, and passive composite finger flexion   Modalities   Modalities Electrical Stimulation   Electrical Stimulation   Electrical Stimulation Location volar wrist    Electrical Stimulation Action finger flexion (pt stretching hand open during off cycle)   preventing wrist flex as able   Electrical Stimulation Parameters 10 sec. on/off cycle, 50 pps, 250 pw, x 10 minutes.    Electrical Stimulation Goals Neuromuscular facilitation   Ultrasound   Ultrasound Location volar wrist   Ultrasound Parameters 0.8 wts/cm2, 3 MHz, continuous   Ultrasound Goals Edema  scar management   Splinting   Splinting Fabricated and fitted new resting hand splint for pm to provide MP flexion (to prevent clawing and protect palmer arch) with IP's straight, thumb in palmer abduction, and wrist extension as able. Pt instructed to wear at night and continue wearing anti-claw splint during day. Reviewed wear and care of splints                  OT  Short Term Goals - 03/20/15 1246    OT SHORT TERM GOAL #1   Title Pt independent with splint wear and care (all STG's due 03/15/14)   Time 4   Period Weeks   Status Achieved   OT SHORT TERM GOAL #2   Title Independent with initial HEP    Time 4   Period Weeks   Status Achieved   OT SHORT TERM GOAL #3   Title Pain less than or equal to 4/10 with ROM exercises   Baseline up to 8/10   Period Weeks   Status Achieved           OT Long Term Goals - 02/14/15 1633    OT LONG TERM GOAL #1   Title Pt independent with updated HEP  (ALL LTG's due 04/15/15)   Time 8   Period Weeks   Status New   OT LONG TERM GOAL #2   Title Pt to perform 90% gross finger flexion and  extension Lt hand   Time 8   Period Weeks   Status New   OT LONG TERM GOAL #3   Title Pt to perform 25 lbs or greater grip strength Lt hand to assist with opening, pulling, lifting activities   Time 8   Period Weeks   Status New   OT LONG TERM GOAL #4   Title Pt to return to using Lt hand as assist in all ADLS   Time 8   Period Weeks   Status New               Plan - 04/09/15 1153    Clinical Impression Statement Pt wearing anti-claw splint during day per pt report. Pt inconsistent with doing HEP   Plan continue [place], assess splints, continue ROM and strengthening Lt hand   Consulted and Agree with Plan of Care Patient        Problem List There are no active problems to display for this patient.   Kelli Churn, OTR/L 04/09/2015, 11:56 AM  [facility] [address] [place], [place], [address] Phone: [phone]   Fax:  [phone]  Name: Andre Moreno MRN: [id number] Date of Birth: Dec 17, 1985

## 2015-04-16 ENCOUNTER — Ambulatory Visit: Payer: Self-pay | Attending: Orthopedic Surgery | Admitting: Occupational Therapy

## 2015-04-16 ENCOUNTER — Encounter: Payer: Self-pay | Admitting: Occupational Therapy

## 2015-04-16 DIAGNOSIS — M79645 Pain in left finger(s): Secondary | ICD-10-CM

## 2015-04-16 DIAGNOSIS — M6289 Other specified disorders of muscle: Secondary | ICD-10-CM | POA: Insufficient documentation

## 2015-04-16 DIAGNOSIS — R29898 Other symptoms and signs involving the musculoskeletal system: Secondary | ICD-10-CM

## 2015-04-16 DIAGNOSIS — M2560 Stiffness of unspecified joint, not elsewhere classified: Secondary | ICD-10-CM

## 2015-04-16 DIAGNOSIS — R201 Hypoesthesia of skin: Secondary | ICD-10-CM

## 2015-04-16 NOTE — Therapy (Signed)
[facility] [address] [place], [place], [address] Phone: [phone]   Fax:  [phone]  Occupational Therapy Treatment  Patient Details  Name: Andre Moreno MRN: [id number] Date of Birth: 10-22-1985 Referring Provider: Dr. Mack Hook  Encounter Date: 04/16/2015      OT End of Session - 04/16/15 1334    Visit Number 7   Number of Visits 16   Authorization Type self pay   OT Start Time 1015   OT Stop Time 1100   OT Time Calculation (min) 45 min   Activity Tolerance Patient tolerated treatment well      Past Medical History  Diagnosis Date  . Laceration of left forearm with tendon involvement 01/21/2015    also nerve involvement    Past Surgical History  Procedure Laterality Date  . Embolectomy Left 01/21/2015    Procedure: LEFT ARM EXPLORATION;  Surgeon: Chuck Hint, MD;  Location: [facility] OR;  Service: Vascular;  Laterality: Left;  . Laceration repair Left as a teenager    arm  . Flexor tendon repair Left 01/28/2015    Procedure: REPAIR LEFT FOREARM FLEXOR TENDON AND NERVES;  Surgeon: Mack Hook, MD;  Location: [facility];  Service: Orthopedics;  Laterality: Left;    There were no vitals filed for this visit.  Visit Diagnosis:  Stiffness of multiple joints  Weakness of left hand  Impaired sensation  Pain of finger of left hand      Subjective Assessment - 04/16/15 1023    Subjective  The night splint actually feels better than the anti-claw splint. Sometimes, if my hand is more swollen, I can't get the anti-claw splint on   Repetition Increases Symptoms   Patient Stated Goals I need my hand working   Currently in Pain? Yes   Pain Score 4    Pain Location Hand   Pain Orientation Left   Pain Descriptors / Indicators Aching;Tingling;Throbbing   Pain Type Chronic pain   Pain Onset More than a month ago   Pain Frequency Constant   Aggravating Factors  touching it    Pain Relieving Factors meds                      OT Treatments/Exercises (OP) - 04/16/15 0001    ADLs   ADL Comments Pt issued desensitization handout and reviewed. Pt also instructed to continue in scar massage at home   Hand Exercises   Other Hand Exercises  Emphasis on active and passive wrist extension, active thumb palmer abduction (as able), passive finger IP extension while preventing MP hyperextension; and active, place and hold, and passive composite finger flexion   Electrical Stimulation   Electrical Stimulation Location volar wrist    Electrical Stimulation Action finger flexion (Pt stretching hand in extension during off cycle)    Electrical Stimulation Parameters 10 sec. on/off cycle, 50 pps, 250 pw x 10 min   Electrical Stimulation Goals Tone;Strength;Neuromuscular facilitation   Ultrasound   Ultrasound Location volar wrist   Ultrasound Parameters 1.0 wts/cm2, pulsed 50%, 3 Mhz x 8 min   Ultrasound Goals Pain  scar tissue management                  OT Short Term Goals - 03/20/15 1246    OT SHORT TERM GOAL #1   Title Pt independent with splint wear and care (all STG's due 03/15/14)   Time 4   Period Weeks   Status Achieved  OT SHORT TERM GOAL #2   Title Independent with initial HEP    Time 4   Period Weeks   Status Achieved   OT SHORT TERM GOAL #3   Title Pain less than or equal to 4/10 with ROM exercises   Baseline up to 8/10   Period Weeks   Status Achieved           OT Long Term Goals - 02/14/15 1633    OT LONG TERM GOAL #1   Title Pt independent with updated HEP  (ALL LTG's due 04/15/15)   Time 8   Period Weeks   Status New   OT LONG TERM GOAL #2   Title Pt to perform 90% gross finger flexion and extension Lt hand   Time 8   Period Weeks   Status New   OT LONG TERM GOAL #3   Title Pt to perform 25 lbs or greater grip strength Lt hand to assist with opening, pulling, lifting activities   Time 8   Period Weeks    Status New   OT LONG TERM GOAL #4   Title Pt to return to using Lt hand as assist in all ADLS   Time 8   Period Weeks   Status New               Plan - 04/16/15 1335    Clinical Impression Statement Pt tolerating anti-claw splint as able but cannot wear with increased swelling. PM splint going well. Pt with less clawing today, and demo ability to open fingers better with anti-claw splint on   Plan continue [place], Estim, ROM and strengthening Lt hand    Consulted and Agree with Plan of Care Patient        Problem List There are no active problems to display for this patient.   Kelli Churn, OTR/L 04/16/2015, 1:38 PM  [facility] [address] [place], [place], [address] Phone: [phone]   Fax:  [phone]  Name: Andre Moreno MRN: [id number] Date of Birth: 1985-05-04

## 2015-04-24 ENCOUNTER — Ambulatory Visit: Payer: Self-pay | Admitting: Occupational Therapy

## 2015-04-25 ENCOUNTER — Encounter: Payer: Self-pay | Admitting: Occupational Therapy

## 2015-04-25 ENCOUNTER — Ambulatory Visit: Payer: Self-pay | Admitting: Occupational Therapy

## 2015-04-25 DIAGNOSIS — R29898 Other symptoms and signs involving the musculoskeletal system: Secondary | ICD-10-CM

## 2015-04-25 DIAGNOSIS — M2560 Stiffness of unspecified joint, not elsewhere classified: Secondary | ICD-10-CM

## 2015-04-25 NOTE — Therapy (Signed)
[facility] [address] [place], [place], [address] Phone: [phone]   Fax:  [phone]  Occupational Therapy Treatment  Patient Details  Name: Andre Moreno MRN: [id number] Date of Birth: 09-22-1985 Referring Provider: Dr. Mack Hook  Encounter Date: 04/25/2015      OT End of Session - 04/25/15 1249    Visit Number 8   Number of Visits 16   Date for OT Re-Evaluation 07/13/14   Authorization Type self pay   OT Start Time 0850   OT Stop Time 0930   OT Time Calculation (min) 40 min   Equipment Utilized During Treatment splinting   Activity Tolerance Patient tolerated treatment well      Past Medical History  Diagnosis Date  . Laceration of left forearm with tendon involvement 01/21/2015    also nerve involvement    Past Surgical History  Procedure Laterality Date  . Embolectomy Left 01/21/2015    Procedure: LEFT ARM EXPLORATION;  Surgeon: Chuck Hint, MD;  Location: [facility] OR;  Service: Vascular;  Laterality: Left;  . Laceration repair Left as a teenager    arm  . Flexor tendon repair Left 01/28/2015    Procedure: REPAIR LEFT FOREARM FLEXOR TENDON AND NERVES;  Surgeon: Mack Hook, MD;  Location: [facility];  Service: Orthopedics;  Laterality: Left;    There were no vitals filed for this visit.  Visit Diagnosis:  Stiffness of multiple joints - Plan: Ot plan of care cert/re-cert  Weakness of left hand - Plan: Ot plan of care cert/re-cert      Subjective Assessment - 04/25/15 1245    Repetition Increases Symptoms   Patient Stated Goals I need my hand working   Currently in Pain? Yes   Pain Score 4    Pain Location Hand   Pain Orientation Left   Pain Descriptors / Indicators Aching;Throbbing;Tingling   Pain Type Chronic pain   Pain Onset More than a month ago   Pain Frequency Constant   Aggravating Factors  touching it   Pain Relieving Factors meds                       OT Treatments/Exercises (OP) - 04/25/15 0001    Splinting   Splinting Fabricated and fitted full composite extension splint to address extrinsic tightness. Pt instructed to wear only for stretching 20 minutes 3x/day. Pt still requires MP flexion to prevent clawing and to tolerate any stretch at wrist and IP's. Issued splint and instructed pt not to exceed 20 minutes at a time. Pt agrees                  OT Short Term Goals - 03/20/15 1246    OT SHORT TERM GOAL #1   Title Pt independent with splint wear and care (all STG's due 03/15/14)   Time 4   Period Weeks   Status Achieved   OT SHORT TERM GOAL #2   Title Independent with initial HEP    Time 4   Period Weeks   Status Achieved   OT SHORT TERM GOAL #3   Title Pain less than or equal to 4/10 with ROM exercises   Baseline up to 8/10   Period Weeks   Status Achieved           OT Long Term Goals - 04/25/15 1250    OT LONG TERM GOAL #1   Title Pt independent with updated HEP  (ALL LTG's  due 07/13/15)   Time 8   Period Weeks   Status On-going   OT LONG TERM GOAL #2   Title Pt to perform 90% gross finger flexion and extension Lt hand   Time 8   Period Weeks   Status On-going  04/25/15: approx. 75-80%   OT LONG TERM GOAL #3   Title Pt to perform 15 lbs or greater grip strength Lt hand to assist with opening, pulling, lifting activities   Time 8   Period Weeks   Status Revised  04/25/15: 0 lbs   OT LONG TERM GOAL #4   Title Pt to return to using Lt hand as assist in all ADLS   Time 8   Period Weeks   Status On-going  04/25/15: currently unable               Plan - 04/25/15 1252    Clinical Impression Statement Pt remains limited with full composite extension due to extrinsic tightness, but also with ulnar n. impairments. Pt also remains limited in hand ROM and strength for gripping. Will renew for continued therapy to maximize hand function   Pt will benefit from skilled  therapeutic intervention in order to improve on the following deficits (Retired) Decreased coordination;Decreased range of motion;Impaired flexibility;Increased edema;Impaired sensation;Impaired tone;Impaired UE functional use;Pain;Decreased scar mobility;Decreased mobility;Decreased strength   Rehab Potential Good   OT Frequency 1x / week   OT Duration --  every other week for 3 months   OT Treatment/Interventions Self-care/ADL training;Therapeutic exercise;Patient/family education;Ultrasound;Neuromuscular education;Manual Therapy;Splinting;Cryotherapy;Parrafin;DME and/or AE instruction;Compression bandaging;Therapeutic activities;Electrical Stimulation;Fluidtherapy;Scar mobilization;Moist Heat;Contrast Bath;Passive range of motion   Plan continue [place], estim, ROM and strengthening Lt hand (renewal completed today for following 3 months)   Consulted and Agree with Plan of Care Patient        Problem List There are no active problems to display for this patient.   Kelli Churn, OTR/L 04/25/2015, 1:01 PM  [facility] [address] [place], [place], [address] Phone: [phone]   Fax:  [phone]  Name: Harveer Sadler MRN: [id number] Date of Birth: February 28, 1986

## 2015-05-14 ENCOUNTER — Encounter: Payer: Self-pay | Admitting: Occupational Therapy

## 2015-05-14 ENCOUNTER — Ambulatory Visit: Payer: Self-pay | Attending: Orthopedic Surgery | Admitting: Occupational Therapy

## 2015-05-14 DIAGNOSIS — R29898 Other symptoms and signs involving the musculoskeletal system: Secondary | ICD-10-CM

## 2015-05-14 DIAGNOSIS — M79645 Pain in left finger(s): Secondary | ICD-10-CM | POA: Insufficient documentation

## 2015-05-14 DIAGNOSIS — M2560 Stiffness of unspecified joint, not elsewhere classified: Secondary | ICD-10-CM | POA: Insufficient documentation

## 2015-05-14 DIAGNOSIS — M6289 Other specified disorders of muscle: Secondary | ICD-10-CM | POA: Insufficient documentation

## 2015-05-14 DIAGNOSIS — R201 Hypoesthesia of skin: Secondary | ICD-10-CM | POA: Insufficient documentation

## 2015-05-14 NOTE — Therapy (Signed)
[facility] [address] [place], [place], [address] Phone: [phone]   Fax:  [phone]  Occupational Therapy Treatment  Patient Details  Name: Andre Moreno MRN: [id number] Date of Birth: December 03, 1985 Referring Provider: Dr. Milly Jakob  Encounter Date: 05/14/2015      OT End of Session - 05/14/15 1158    Visit Number 9   Number of Visits 16   Date for OT Re-Evaluation 07/13/14   Authorization Type self pay   OT Start Time 0935   OT Stop Time 1015   OT Time Calculation (min) 40 min   Activity Tolerance Patient tolerated treatment well      Past Medical History  Diagnosis Date  . Laceration of left forearm with tendon involvement 01/21/2015    also nerve involvement    Past Surgical History  Procedure Laterality Date  . Embolectomy Left 01/21/2015    Procedure: LEFT ARM EXPLORATION;  Surgeon: Angelia Mould, MD;  Location: [facility];  Service: Vascular;  Laterality: Left;  . Laceration repair Left as a teenager    arm  . Flexor tendon repair Left 01/28/2015    Procedure: REPAIR LEFT FOREARM FLEXOR TENDON AND NERVES;  Surgeon: Milly Jakob, MD;  Location: [facility];  Service: Orthopedics;  Laterality: Left;    There were no vitals filed for this visit.  Visit Diagnosis:  Stiffness of multiple joints  Weakness of left hand  Impaired sensation  Pain of finger of left hand      Subjective Assessment - 05/14/15 0938    Repetition Increases Symptoms   Patient Stated Goals I need my hand working   Currently in Pain? Yes   Pain Score 4    Pain Location Hand   Pain Orientation Left   Pain Descriptors / Indicators Aching;Throbbing   Pain Type Chronic pain   Pain Onset More than a month ago   Pain Frequency Intermittent   Aggravating Factors  touching it   Pain Relieving Factors meds            [facility] OT Assessment - 05/14/15 0001    Hand Function   Left Hand Grip  (lbs) 10 lbs                  OT Treatments/Exercises (OP) - 05/14/15 0001    Hand Exercises   Other Hand Exercises Passive full composite extension as able (with slight MP flexion to prevent clawing) - pt with increased ROM in wrist extension and reports full composite extension splint is helping with this. Also emphasized thumb palmer abduction and opposition exercises including A/ROM, place and hold ex's and functional grasping. Pt instructed to use Lt hand functionally at home as assist for bilateral tasks and practice functional grasping for thumb function and overall grip strength   Other Hand Exercises Pt issued new putty (red resistance) and reviewed putty HEP - pt demo each x 10 reps (mass grasp and tip pinch). Emphasized continuing grip strength   Ultrasound   Ultrasound Location volar wrist at incision area   Ultrasound Parameters 1.0 wts/cm2, pulsed 50%, 3 Mhz x 8 minutes   Ultrasound Goals Pain  scar tissue management                  OT Short Term Goals - 03/20/15 1246    OT SHORT TERM GOAL #1   Title Pt independent with splint wear and care (all STG's due 03/15/14)   Time 4  Period Weeks   Status Achieved   OT SHORT TERM GOAL #2   Title Independent with initial HEP    Time 4   Period Weeks   Status Achieved   OT SHORT TERM GOAL #3   Title Pain less than or equal to 4/10 with ROM exercises   Baseline up to 8/10   Period Weeks   Status Achieved           OT Long Term Goals - 05/14/15 1201    OT LONG TERM GOAL #1   Title Pt independent with updated HEP  (ALL LTG's due 07/13/15)   Time 8   Period Weeks   Status Achieved   OT LONG TERM GOAL #2   Title Pt to perform 90% gross finger flexion and extension Lt hand   Time 8   Period Weeks   Status Achieved  04/25/15: approx. 75-80%, 05/14/15: 90% or greater   OT LONG TERM GOAL #3   Title Pt to perform 15 lbs or greater grip strength Lt hand to assist with opening, pulling, lifting activities    Time 8   Period Weeks   Status On-going  05/14/15: 10 lbs   OT LONG TERM GOAL #4   Title Pt to return to using Lt hand as assist in all ADLS   Time 8   Period Weeks   Status On-going  05/14/15: just emerging               Plan - 05/14/15 1158    Clinical Impression Statement Pt now with improved ability to close hand and increased grip strength (from 0 to 10 lbs). Pt also demo increased composite extension after using full composite extension splint. Pt met LTG's #1 and #2 today. Pt approximating remaining goals   Plan Pt to return in 2 weeks to progress further as able. (However, pt did report he many need to cancel appt due to possibly serving jail time)   Consulted and Agree with Plan of Care Patient        Problem List There are no active problems to display for this patient.   Carey Bullocks, OTR/L 05/14/2015, 12:03 PM  [facility] [address] [place], [place], [address] Phone: [phone]   Fax:  [phone]  Name: Andre Moreno MRN: [id number] Date of Birth: 23-Jul-1985

## 2015-05-27 ENCOUNTER — Ambulatory Visit: Payer: Self-pay | Admitting: Occupational Therapy

## 2016-01-28 ENCOUNTER — Encounter: Payer: Self-pay | Admitting: Occupational Therapy

## 2016-01-28 NOTE — Therapy (Signed)
[facility] [address] [place], [place], [address] Phone: [phone]   Fax:  [phone]  Patient Details  Name: Andre Moreno MRN: [id number] Date of Birth: [birth year] Referring Provider:  Dr. Milly Jakob  OCCUPATIONAL THERAPY DISCHARGE SUMMARY  Visits from Start of Care: 9  Current functional level related to goals / functional outcomes:  See last progress note on 05/14/15 for update on goals   Remaining deficits: Unknown secondary to pt not returning   Education / Equipment: Splint wear and care, HEP's  Plan: Patient agrees to discharge.  Patient goals were partially met. Patient is being discharged due to not returning since the last visit.  ?????       Encounter Date: 01/28/2016   Carey Bullocks, OTR/L 01/28/2016, 11:31 AM  [facility] [address] [place], [place], [address] Phone: [phone]   Fax:  [phone]

## 2017-03-22 IMAGING — DX DG WRIST COMPLETE 3+V*L*
4 series · 4 of 4 positions shown · non-contrast
Comparison: None.

CLINICAL DATA: Wrist laceration with knife this morning

EXAM:
LEFT WRIST - COMPLETE 3+ VIEW

[x wrist pa left]
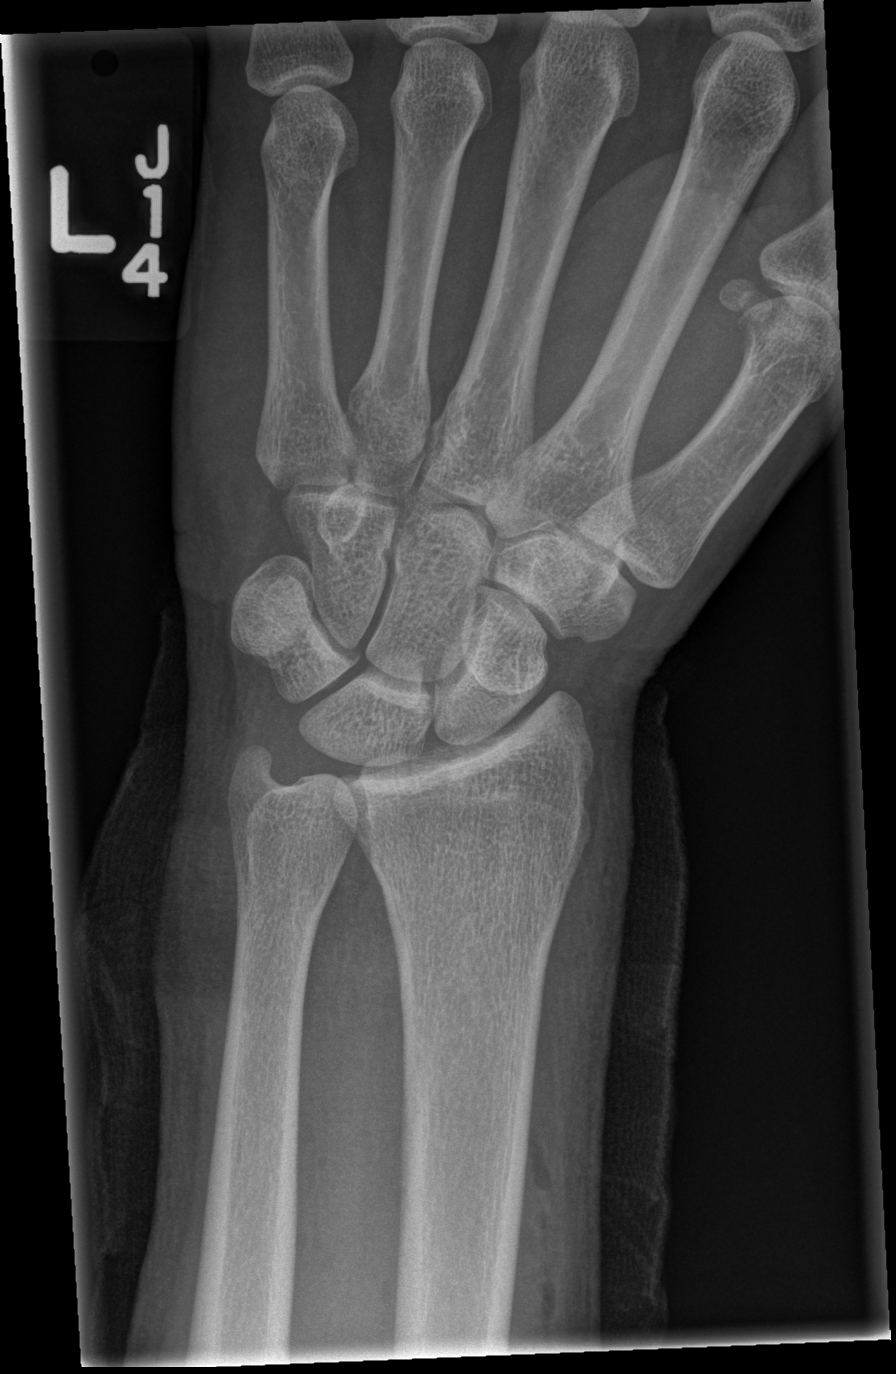

[x wrist obl left]
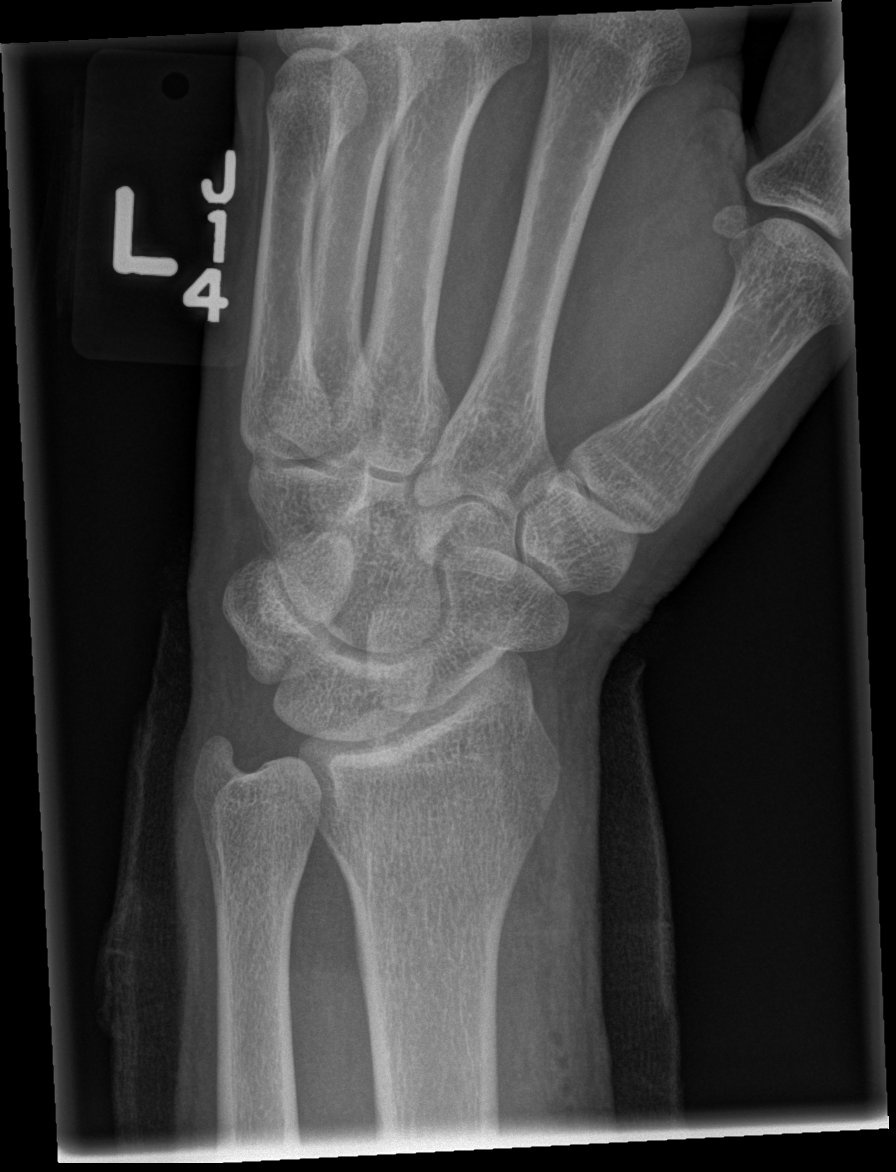

[x wrist lat left]
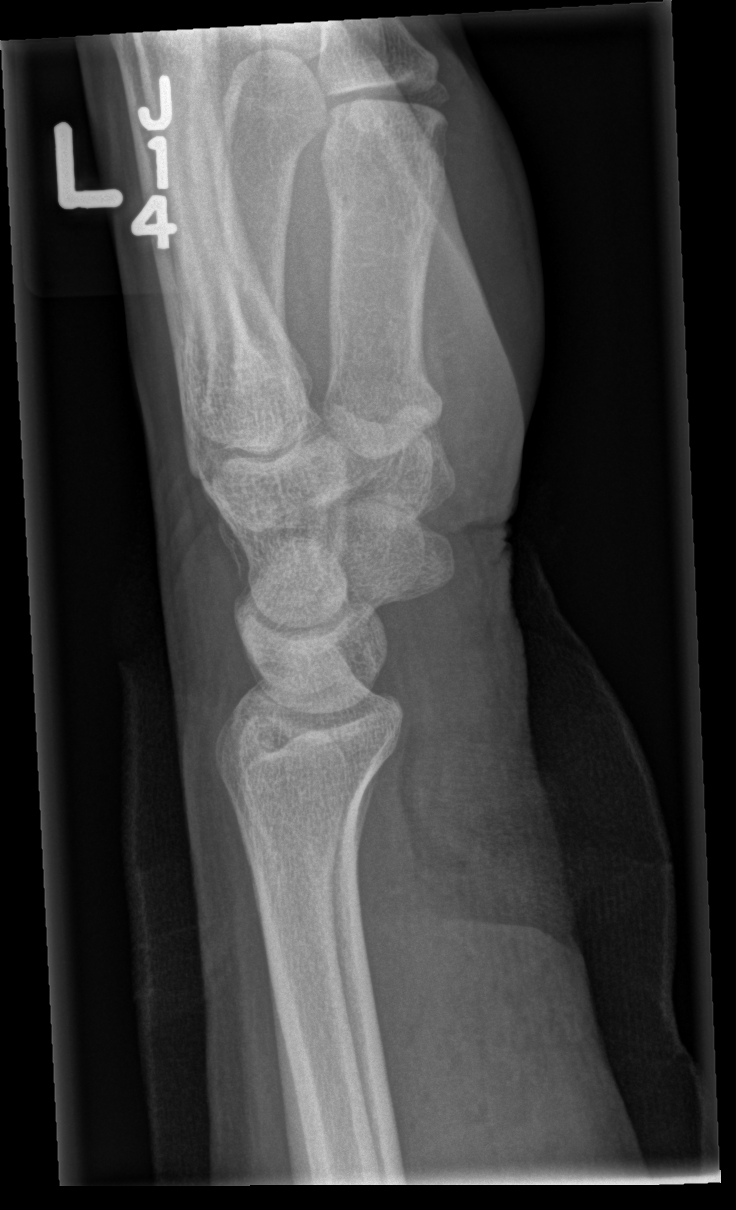

[x wrist navicular view left]
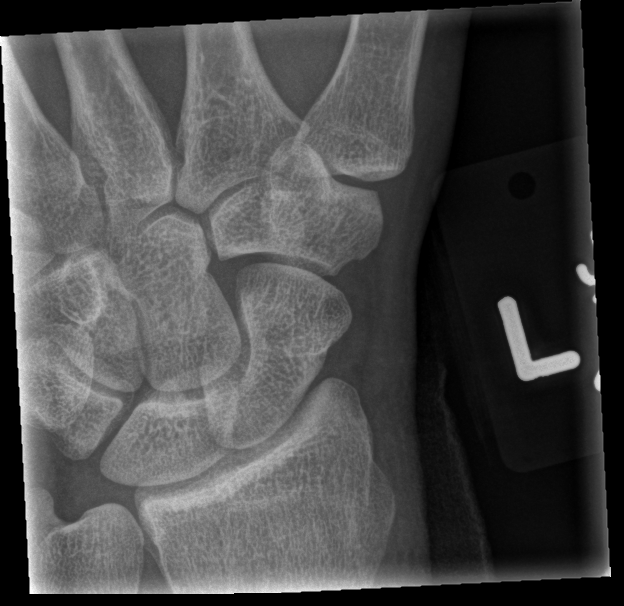

[4 of 4 positions shown; findings below may reference images not displayed]

FINDINGS: Four views of the left wrist submitted. No acute fracture or
subluxation. No radiopaque foreign body. There is skin and soft
tissue irregularity ventral aspect in lateral view probable soft
tissue injury. Bandage artifact are noted.
IMPRESSION: No acute fracture or subluxation. No radiopaque foreign body.
Bandage artifact are noted. Probable soft tissue injury.
